# Patient Record
Sex: Male | Born: 1955
Health system: Southern US, Community
[De-identification: ages and names within clinical notes are randomized; demographics above are authoritative.]

---

## 2003-02-11 ENCOUNTER — Encounter: Admission: RE | Admit: 2003-02-11 | Discharge: 2003-05-12 | Payer: Self-pay | Admitting: *Deleted

## 2003-06-04 ENCOUNTER — Encounter: Admission: RE | Admit: 2003-06-04 | Discharge: 2003-09-02 | Payer: Self-pay | Admitting: *Deleted

## 2004-10-16 ENCOUNTER — Encounter: Admission: RE | Admit: 2004-10-16 | Discharge: 2004-10-16 | Payer: Self-pay | Admitting: General Surgery

## 2004-10-20 ENCOUNTER — Ambulatory Visit (HOSPITAL_BASED_OUTPATIENT_CLINIC_OR_DEPARTMENT_OTHER): Admission: RE | Admit: 2004-10-20 | Discharge: 2004-10-20 | Payer: Self-pay | Admitting: General Surgery

## 2004-10-20 ENCOUNTER — Ambulatory Visit (HOSPITAL_COMMUNITY): Admission: RE | Admit: 2004-10-20 | Discharge: 2004-10-20 | Payer: Self-pay | Admitting: General Surgery

## 2012-01-05 ENCOUNTER — Other Ambulatory Visit: Payer: Self-pay | Admitting: Endocrinology

## 2012-01-05 ENCOUNTER — Ambulatory Visit
Admission: RE | Admit: 2012-01-05 | Discharge: 2012-01-05 | Disposition: A | Discharge status: Home / Self Care | Payer: BC Managed Care – PPO | Source: Ambulatory Visit | Attending: Endocrinology | Admitting: Endocrinology

## 2012-01-05 DIAGNOSIS — E049 Nontoxic goiter, unspecified: Secondary | ICD-10-CM

## 2014-05-29 IMAGING — US US SOFT TISSUE HEAD/NECK
1 series · 14 of 25 positions shown · non-contrast
Comparison: None.

CLINICAL DATA: Thyromegaly

THYROID ULTRASOUND
TECHNIQUE: Ultrasound examination of the thyroid gland and adjacent
soft tissues was performed.

[Series 1: us soft tissue head/neck · 0.07mm/px · 14 of 30 slices shown]
[im 1/30]
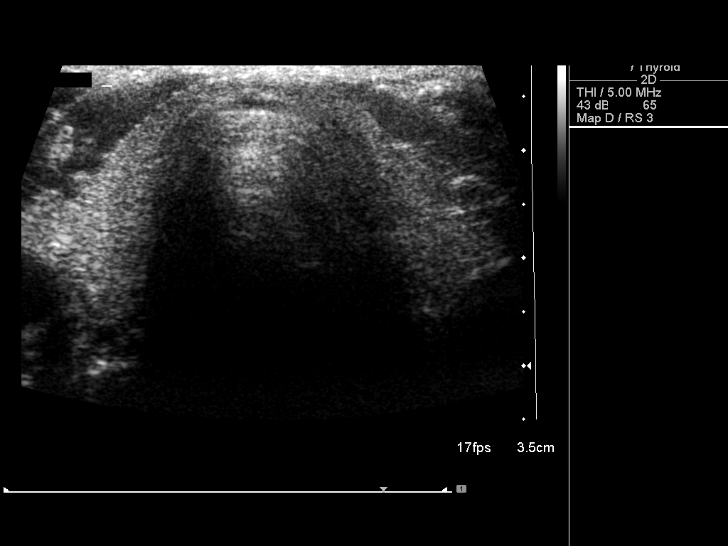
[im 3/30]
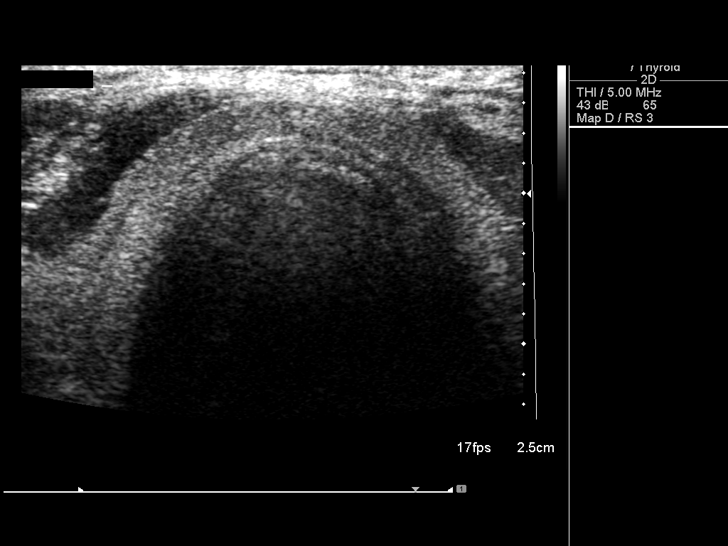
[im 5/30]
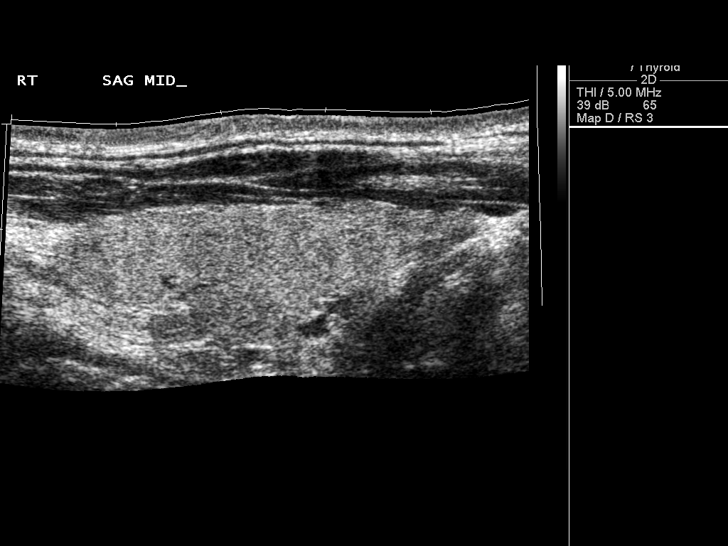
[im 8/30]
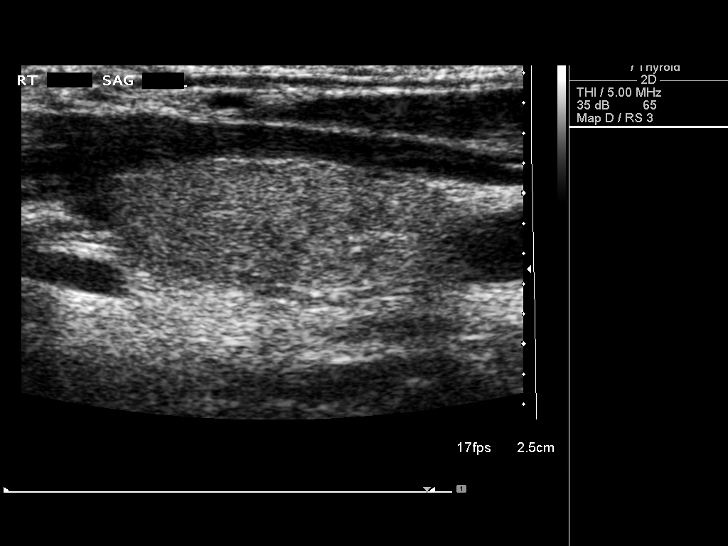
[im 10/30]
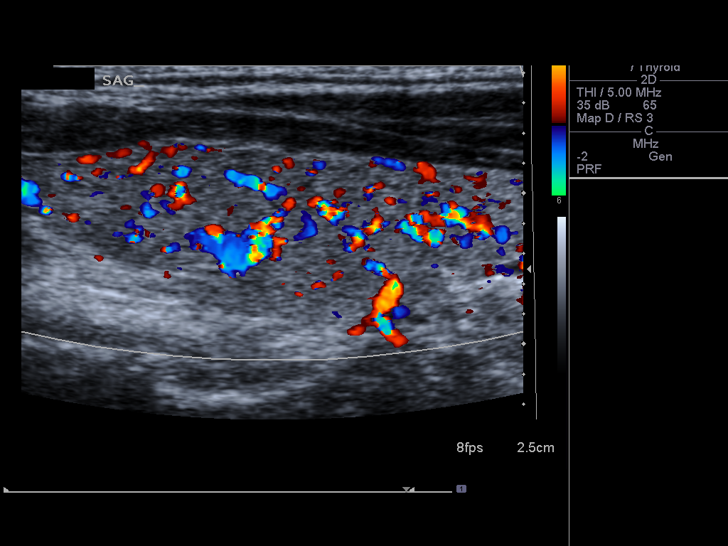
[im 11/30]
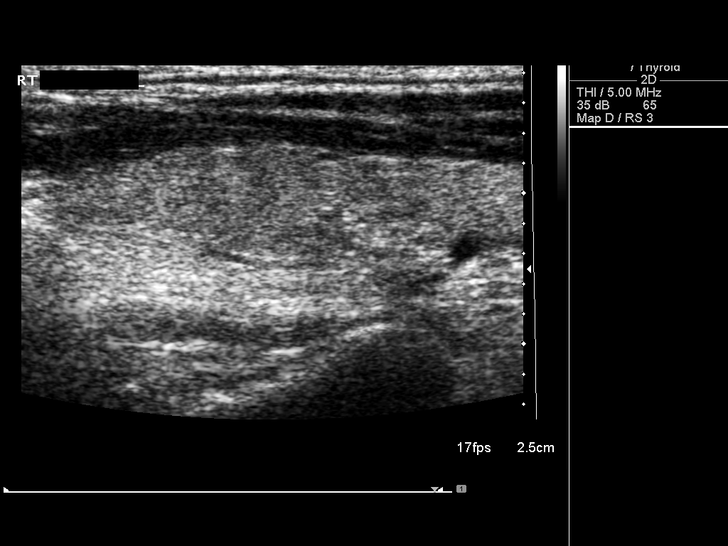
[im 14/30]
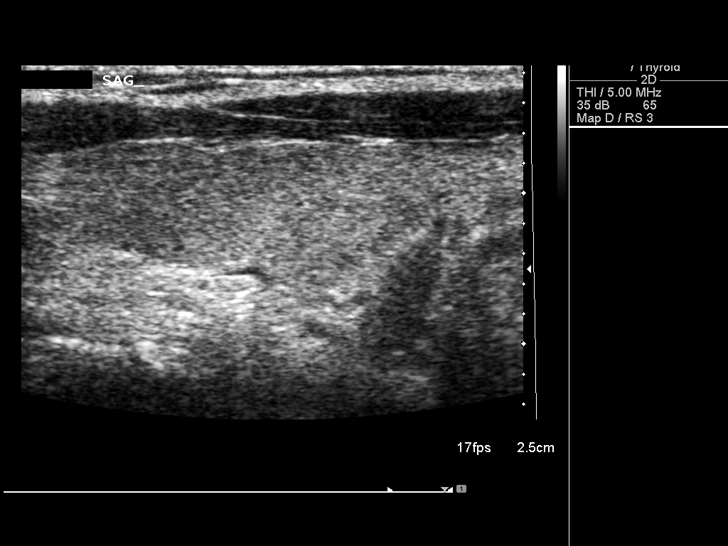
[im 16/30]
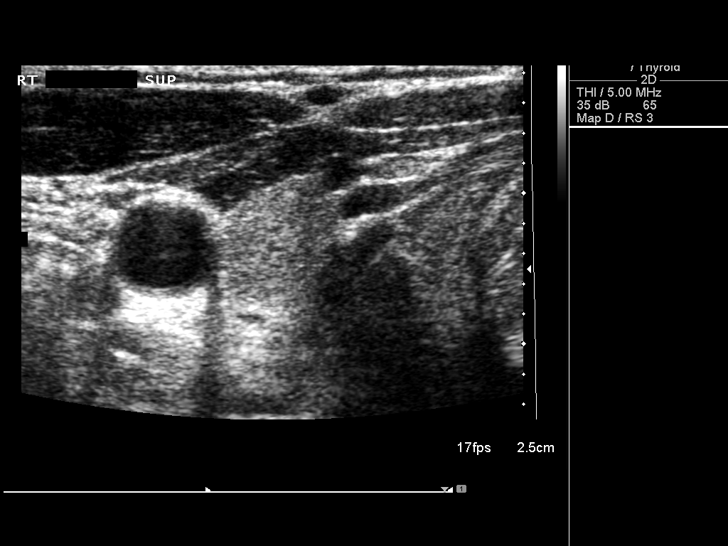
[im 19/30]
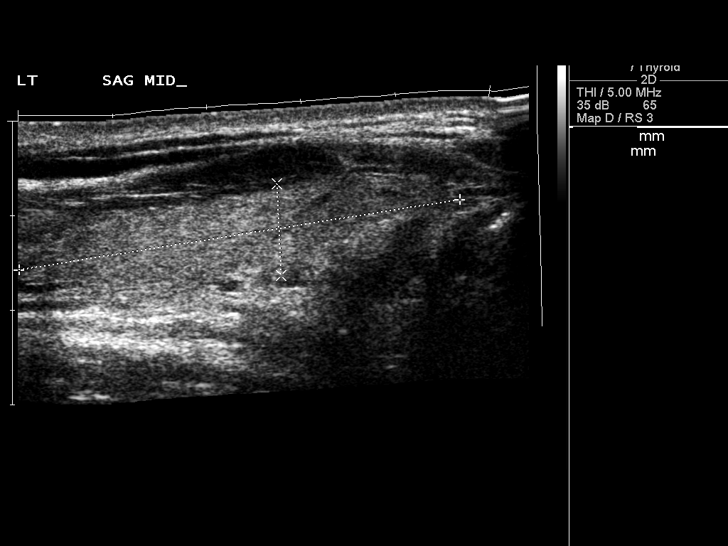
[im 20/30]
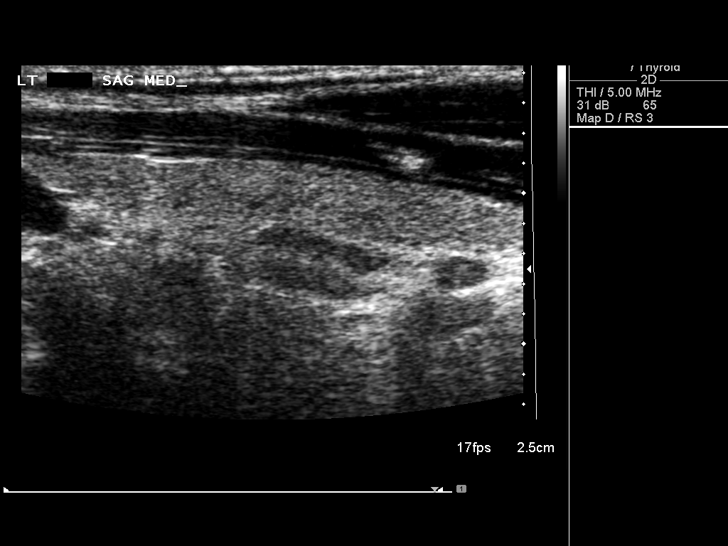
[im 22/30]
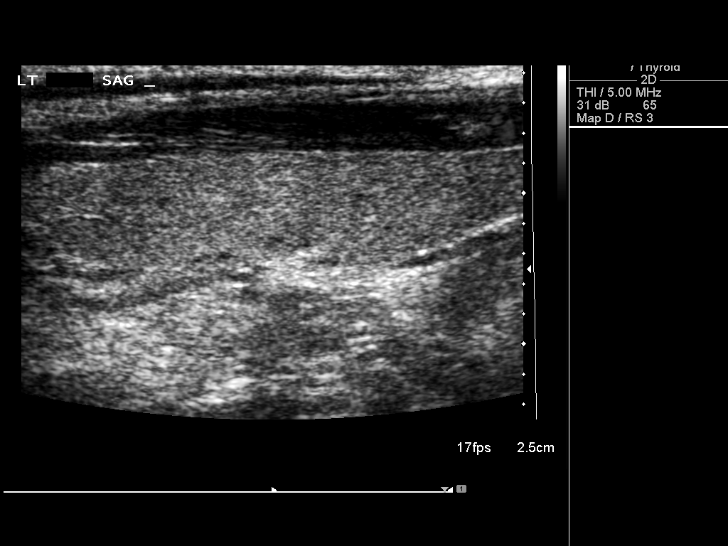
[im 25/30]
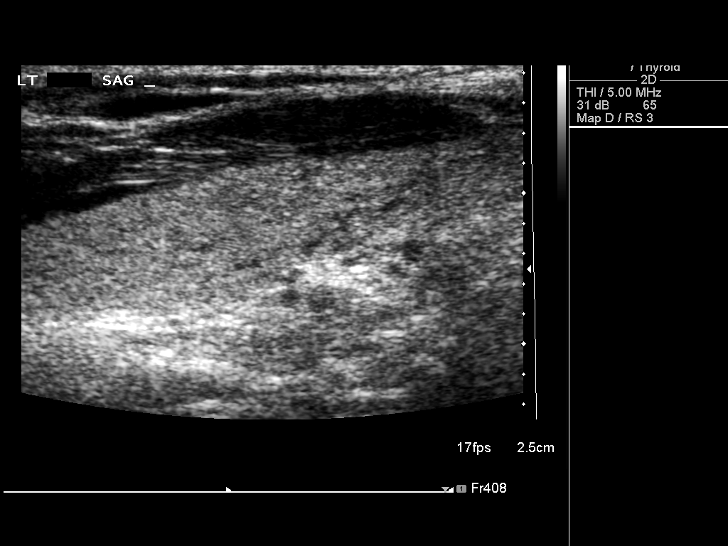
[im 27/30]
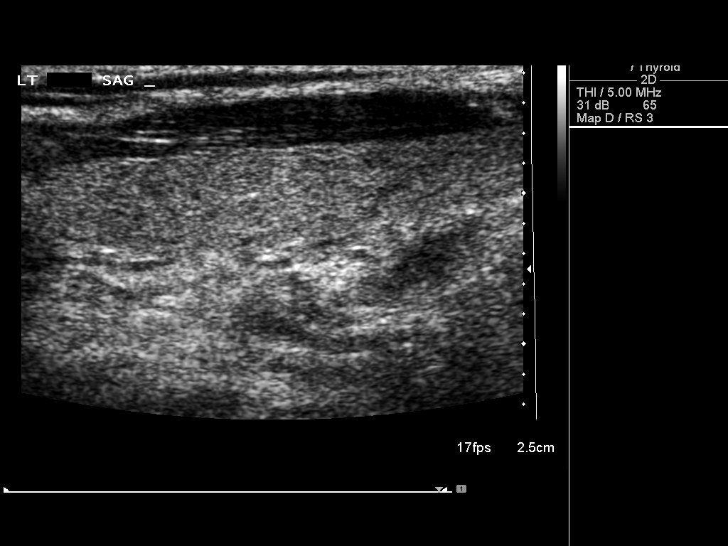
[im 30/30]
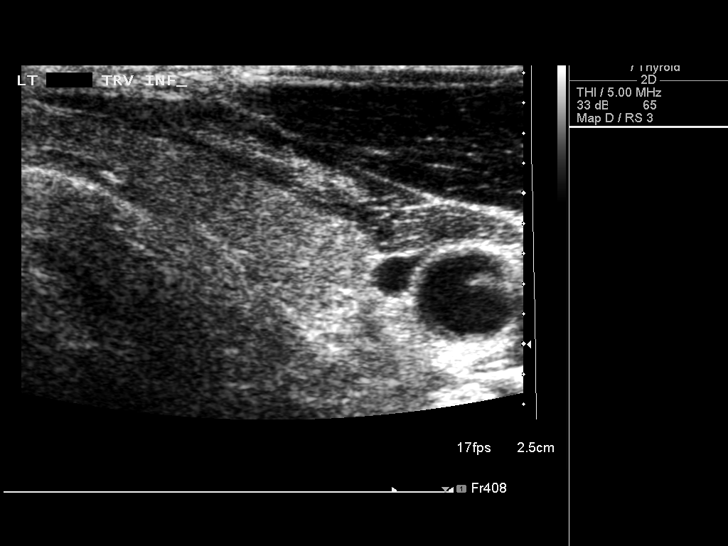

[14 of 25 positions shown; findings below may reference images not displayed]

FINDINGS: Right thyroid lobe:  4.2 x 1.4 x 1.7 cm.  Homogeneous.
Left thyroid lobe:  4.7 x 1.0 x 1.3 cm.  Homogeneous.
Isthmus:  3 mm in thickness.

Focal nodules:  None.

Lymphadenopathy:  None visualized.
IMPRESSION: Within normal limits.

## 2018-01-13 ENCOUNTER — Ambulatory Visit
Admission: RE | Admit: 2018-01-13 | Discharge: 2018-01-13 | Disposition: A | Discharge status: Home / Self Care | Payer: BLUE CROSS/BLUE SHIELD | Source: Ambulatory Visit | Attending: Family Medicine | Admitting: Family Medicine

## 2018-01-13 ENCOUNTER — Other Ambulatory Visit: Payer: Self-pay | Admitting: Family Medicine

## 2018-01-13 DIAGNOSIS — M79641 Pain in right hand: Secondary | ICD-10-CM

## 2018-03-02 ENCOUNTER — Encounter: Payer: Self-pay | Admitting: Pulmonary Disease

## 2018-03-29 ENCOUNTER — Encounter: Payer: Self-pay | Admitting: Pulmonary Disease

## 2018-03-29 ENCOUNTER — Ambulatory Visit (INDEPENDENT_AMBULATORY_CARE_PROVIDER_SITE_OTHER): Payer: BLUE CROSS/BLUE SHIELD | Admitting: Pulmonary Disease

## 2018-03-29 ENCOUNTER — Telehealth: Payer: Self-pay | Admitting: Pulmonary Disease

## 2018-03-29 VITALS — BP 152/94 | HR 85

## 2018-03-29 DIAGNOSIS — G4733 Obstructive sleep apnea (adult) (pediatric): Secondary | ICD-10-CM | POA: Diagnosis not present

## 2018-03-29 NOTE — Progress Notes (Signed)
Matthew Mosley    [id number]    [birth year]  Primary Care Physician:Reade, Molly Maduro, MD  Referring Physician: Elias Else, MD [phone] [address] [place], [place] [address]  Chief complaint:   Recent diagnosis of obstructive sleep apnea  HPI:  I am still study consistent with mild obstructive sleep apnea Significant daytime symptoms Spouse has noticed several times at night that he is not breathing and had to nudge him to breathe He goes to bed about 10:30 PM, falls asleep in about 10 minutes Wakes up about 6 AM No recent weight changes No dry mouth in the mornings No memory issues  He does have gasping respirations at night sometimes  No significant occupational predisposition  Pets:cat   No outpatient encounter medications on file as of 03/29/2018.   No facility-administered encounter medications on file as of 03/29/2018.     Allergies as of 03/29/2018  . (Not on File)    No past medical history on file.  No family history on file.  Social History   Socioeconomic History  . Marital status: Married    Spouse name: Not on file  . Number of children: Not on file  . Years of education: Not on file  . Highest education level: Not on file  Occupational History  . Not on file  Social Needs  . Financial resource strain: Not on file  . Food insecurity:    Worry: Not on file    Inability: Not on file  . Transportation needs:    Medical: Not on file    Non-medical: Not on file  Tobacco Use  . Smoking status: Never Smoker  . Smokeless tobacco: Never Used  Substance and Sexual Activity  . Alcohol use: Not on file  . Drug use: Not on file  . Sexual activity: Not on file  Lifestyle  . Physical activity:    Days per week: Not on file    Minutes per session: Not on file  . Stress: Not on file  Relationships  . Social connections:    Talks on phone: Not on file    Gets together: Not on file    Attends religious service: Not on file   Active member of club or organization: Not on file    Attends meetings of clubs or organizations: Not on file    Relationship status: Not on file  . Intimate partner violence:    Fear of current or ex partner: Not on file    Emotionally abused: Not on file    Physically abused: Not on file    Forced sexual activity: Not on file  Other Topics Concern  . Not on file  Social History Narrative  . Not on file    Review of Systems  Constitutional: Negative.  Negative for diaphoresis.  HENT: Negative.   Eyes: Negative.   Respiratory: Positive for apnea. Negative for cough and choking.   Cardiovascular: Negative.   Gastrointestinal: Negative.   Endocrine: Negative.   Genitourinary: Negative.   Allergic/Immunologic: Negative.   Neurological: Negative.   Psychiatric/Behavioral: Positive for sleep disturbance.  All other systems reviewed and are negative.   Vitals:   03/29/18 1515  BP: (!) 152/94  Pulse: 85  SpO2: 96%     Physical Exam  Constitutional: He is oriented to person, place, and time. He appears well-developed and well-nourished.  HENT:  Head: Normocephalic.  Mallampati 2  Eyes: Pupils are equal, round, and reactive to  light. Conjunctivae and EOM are normal. Right eye exhibits no discharge. Left eye exhibits no discharge.  Neck: Normal range of motion. Neck supple. No tracheal deviation present. No thyromegaly present.  Cardiovascular: Normal rate and regular rhythm.  No murmur heard. Pulmonary/Chest: Effort normal and breath sounds normal. No respiratory distress. He has no wheezes.  Abdominal: Soft. Bowel sounds are normal. He exhibits no distension.  Musculoskeletal: Normal range of motion. He exhibits no edema.  Neurological: He is alert and oriented to person, place, and time. He has normal reflexes.  Skin: Skin is warm and dry. No erythema.  Psychiatric: He has a normal mood and affect.   Data Reviewed: Sleep study did reveal mild obstructive sleep  apnea  Assessment:   Mild obstructive sleep apnea with significant daytime symptoms  Excessive daytime sleepiness   Plan/Recommendations:  We will start the patient on auto titrating CPAP therapy  Pressure settings of 5-15  We will see him back in about 2 months following initiation of treatment  Follow-up with compliance monitoring   Virl Diamond MD [facility] 03/29/2018, 3:20 PM  CC: Elias Else, MD

## 2018-03-29 NOTE — Telephone Encounter (Signed)
Home sleep study did reveal mild obstructive sleep apnea  We will place him on auto titrating CPAP pressure settings of 5-15  We will see him back in the office in about 6 to 8 weeks with compliance monitoring

## 2018-03-29 NOTE — Patient Instructions (Addendum)
History of obstructive sleep apnea diagnosed recently  We do not have the report currently available for you  Options of treatment as discussed  We will take a look at the study and give you call we will respect to CPAP treatment  I would like to see you back in the office 6 to 8 weeks after you start treatment to evaluate efficiency of the treatment

## 2018-03-30 NOTE — Telephone Encounter (Signed)
Spoke with patient, informed patient of results per MD Olalere. Voiced understanding. Order placed for cpap. Nothing further needed at this time. Appointment made for 05/16/2014 at 2:00pm for follow up.

## 2018-03-30 NOTE — Telephone Encounter (Signed)
Called and left a message on machine. Will call back.

## 2018-03-30 NOTE — Telephone Encounter (Signed)
Pt is calling back [phone]

## 2018-05-16 ENCOUNTER — Ambulatory Visit: Payer: BLUE CROSS/BLUE SHIELD | Admitting: Pulmonary Disease

## 2018-06-01 ENCOUNTER — Ambulatory Visit: Payer: BLUE CROSS/BLUE SHIELD | Admitting: Pulmonary Disease

## 2018-06-01 ENCOUNTER — Encounter: Payer: Self-pay | Admitting: Pulmonary Disease

## 2018-06-01 VITALS — BP 110/84 | HR 66 | Ht 68.0 in | Wt 181.0 lb

## 2018-06-01 DIAGNOSIS — G4733 Obstructive sleep apnea (adult) (pediatric): Secondary | ICD-10-CM | POA: Diagnosis not present

## 2018-06-01 DIAGNOSIS — Z9989 Dependence on other enabling machines and devices: Secondary | ICD-10-CM | POA: Diagnosis not present

## 2018-06-01 NOTE — Progress Notes (Signed)
Matthew Mosley    [id number]    [birth year]  Primary Care Physician:Reade, Molly Maduroobert, MD  Referring Physician: Elias Elseeade, Robert, MD [phone] [address] [place], [place] [address]  Chief complaint:   Recent diagnosis of obstructive sleep apnea Tolerating CPAP well  HPI:  Denies significant daytime symptoms are present Tolerating CPAP well, had to change masks initially Is gotten used to using it on a regular basis Wakes up feeling like is not a good nights rest  He goes to bed about 10:30 PM, falls asleep in about 10 minutes Wakes up about 6 AM No recent weight changes No dry mouth in the mornings No memory issues  No significant occupational predisposition  Pets:cat   Outpatient Encounter Medications as of 06/01/2018  Medication Sig  . ALPRAZolam (XANAX) 0.5 MG tablet   . citalopram (CELEXA) 20 MG tablet   . lovastatin (MEVACOR) 20 MG tablet   . NOVOLOG 100 UNIT/ML injection    No facility-administered encounter medications on file as of 06/01/2018.     Allergies as of 06/01/2018  . (Not on File)    No past medical history on file.  No family history on file.  Social History   Socioeconomic History  . Marital status: Married    Spouse name: Not on file  . Number of children: Not on file  . Years of education: Not on file  . Highest education level: Not on file  Occupational History  . Not on file  Social Needs  . Financial resource strain: Not on file  . Food insecurity:    Worry: Not on file    Inability: Not on file  . Transportation needs:    Medical: Not on file    Non-medical: Not on file  Tobacco Use  . Smoking status: Never Smoker  . Smokeless tobacco: Never Used  Substance and Sexual Activity  . Alcohol use: Not on file  . Drug use: Not on file  . Sexual activity: Not on file  Lifestyle  . Physical activity:    Days per week: Not on file    Minutes per session: Not on file  . Stress: Not on file  Relationships  .  Social connections:    Talks on phone: Not on file    Gets together: Not on file    Attends religious service: Not on file    Active member of club or organization: Not on file    Attends meetings of clubs or organizations: Not on file    Relationship status: Not on file  . Intimate partner violence:    Fear of current or ex partner: Not on file    Emotionally abused: Not on file    Physically abused: Not on file    Forced sexual activity: Not on file  Other Topics Concern  . Not on file  Social History Narrative  . Not on file    Review of Systems  Constitutional: Negative.  Negative for diaphoresis.  HENT: Negative.   Eyes: Negative.   Respiratory: Positive for apnea. Negative for cough and choking.   Psychiatric/Behavioral: Positive for sleep disturbance.  All other systems reviewed and are negative.   Vitals:   06/01/18 1415  BP: 110/84  Pulse: 66  SpO2: 98%     Physical Exam  Constitutional: He appears well-developed and well-nourished.  HENT:  Head: Normocephalic and atraumatic.  Mallampati 2  Eyes: Pupils are equal, round, and reactive to light.  Conjunctivae and EOM are normal. Right eye exhibits no discharge. Left eye exhibits no discharge.  Neck: Normal range of motion. Neck supple. No tracheal deviation present. No thyromegaly present.  Cardiovascular: Normal rate and regular rhythm.  No murmur heard. Pulmonary/Chest: Effort normal and breath sounds normal. No respiratory distress. He has no wheezes.  Abdominal: Soft. Bowel sounds are normal. He exhibits no distension. There is no tenderness.   Data Reviewed: Sleep study did reveal mild obstructive sleep apnea  Download from his machine does reveal 100% compliance with a residual AHI of 3.6, no significant leaks  Assessment:   Mild obstructive sleep apnea currently on auto titrating CPAP Excessive daytime sleepiness appears to be better Tolerating treatment well  Plan/Recommendations:  Continue  auto titrating CPAP set at 5-15  I will see him back in the office in about 6 months  Encouraged to call if any significant concerns   Virl Diamond MD [facility] 06/01/2018, 2:21 PM  CC: Elias Else, MD

## 2018-06-01 NOTE — Patient Instructions (Signed)
Obstructive sleep apnea Adequately treated with auto titrating CPAP  We are not able to get a download at present but CPAP appears to be working well  I will see her back in the office in about 6 months  Call with any significant concerns or questions

## 2019-10-27 ENCOUNTER — Ambulatory Visit: Payer: Self-pay | Attending: Internal Medicine

## 2019-10-27 DIAGNOSIS — Z23 Encounter for immunization: Secondary | ICD-10-CM

## 2019-10-27 NOTE — Progress Notes (Signed)
   Covid-19 Vaccination Clinic  Name:  OWYN RAULSTON    MRN: [id number] DOB: [birth year]  10/27/2019  Mr. Barth was observed post Covid-19 immunization for 15 minutes without incident. He was provided with Vaccine Information Sheet and instruction to access the V-Safe system.   Mr. Barnier was instructed to call 911 with any severe reactions post vaccine: Marland Kitchen Difficulty breathing  . Swelling of face and throat  . A fast heartbeat  . A bad rash all over body  . Dizziness and weakness   Immunizations Administered    Name Date Dose VIS Date Route   Pfizer COVID-19 Vaccine 10/27/2019 10:38 AM 0.3 mL 06/29/2019 Intramuscular   Manufacturer: [employer], [employer]   Lot: [id number]   NDC: [address]-7

## 2019-11-20 ENCOUNTER — Ambulatory Visit: Payer: Self-pay | Attending: Internal Medicine

## 2019-11-20 DIAGNOSIS — Z23 Encounter for immunization: Secondary | ICD-10-CM

## 2019-11-20 NOTE — Progress Notes (Signed)
   Covid-19 Vaccination Clinic  Name:  Matthew Mosley    MRN: [id number] DOB: [birth year]  11/20/2019  Mr. Matthew Mosley was observed post Covid-19 immunization for 15 minutes without incident. He was provided with Vaccine Information Sheet and instruction to access the V-Safe system.   Mr. Matthew Mosley was instructed to call 911 with any severe reactions post vaccine: Marland Kitchen Difficulty breathing  . Swelling of face and throat  . A fast heartbeat  . A bad rash all over body  . Dizziness and weakness   Immunizations Administered    Name Date Dose VIS Date Route   Pfizer COVID-19 Vaccine 11/20/2019 11:54 AM 0.3 mL 09/12/2018 Intramuscular   Manufacturer: [employer], [employer]   Lot: [id number]   NDC: [address]-7

## 2020-02-06 ENCOUNTER — Encounter: Payer: Self-pay | Admitting: Adult Health

## 2020-02-06 ENCOUNTER — Other Ambulatory Visit: Payer: Self-pay

## 2020-02-06 ENCOUNTER — Ambulatory Visit (INDEPENDENT_AMBULATORY_CARE_PROVIDER_SITE_OTHER): Payer: 59 | Admitting: Adult Health

## 2020-02-06 DIAGNOSIS — G4733 Obstructive sleep apnea (adult) (pediatric): Secondary | ICD-10-CM | POA: Diagnosis not present

## 2020-02-06 NOTE — Progress Notes (Signed)
@Patient  ID: , male    DOB: March 12, 1956, 64 y.o.   MRN: [id number]  Chief Complaint  Patient presents with  . Follow-up    OSA    Referring provider: [id number], MD  HPI: 64 year old male followed for obstructive sleep apnea on nocturnal CPAP Medical history is significant for T1 DM   TEST/EVENTS :  September 2019 home sleep study showed mild obstructive sleep apnea  02/06/2020 Follow up : OSA  Patient returns for a follow-up visit for sleep apnea.  Patient was last seen September 2019.  Patient says he remains on CPAP at bedtime.  Says he tries to wear it every night for about 6 to 7 hours.  Does not have daytime sleepiness.  Says he would like to switch from nasal pillows to a full mask.  Feels that the pressure is not strong enough at times.  CPAP download shows excellent compliance with 100% usage.  Daily average usage at 6.5 hours.  Patient is on auto CPAP 5 to 15 cm H2O.  AHI is 14.6.  Positive leaks.October 2019 he is very active, [employer]. Constantly working on projects.   Not on File  Immunization History  Administered Date(s) Administered  . Influenza Whole 05/01/2018  . PFIZER SARS-COV-2 Vaccination 10/27/2019, 11/20/2019  . Pneumococcal Polysaccharide-23 05/01/2018    History reviewed. No pertinent past medical history.  Tobacco History: Social History   Tobacco Use  Smoking Status Never Smoker  Smokeless Tobacco Never Used   Counseling given: Not Answered   Outpatient Medications Prior to Visit  Medication Sig Dispense Refill  . acetaminophen (TYLENOL) 325 MG tablet Take 650 mg by mouth every 6 (six) hours as needed.    . ALPRAZolam (XANAX) 0.5 MG tablet   0  . aspirin 81 MG chewable tablet Chew by mouth daily.    05/03/2018 buPROPion (WELLBUTRIN XL) 150 MG 24 hr tablet Take 150 mg by mouth daily.    . cholecalciferol (VITAMIN D3) 25 MCG (1000 UNIT) tablet Take 2,000 Units by mouth daily.    . citalopram (CELEXA) 20 MG tablet   2  . famotidine  (PEPCID) 40 MG tablet Take 40 mg by mouth at bedtime.    . lovastatin (MEVACOR) 20 MG tablet   3  . Multiple Vitamin (MULTIVITAMIN WITH MINERALS) TABS tablet Take 1 tablet by mouth daily.    Marland Kitchen NOVOLOG 100 UNIT/ML injection   9   No facility-administered medications prior to visit.     Review of Systems:   Constitutional:   No  weight loss, night sweats,  Fevers, chills, fatigue, or  lassitude.  HEENT:   No headaches,  Difficulty swallowing,  Tooth/dental problems, or  Sore throat,                No sneezing, itching, ear ache, nasal congestion, post nasal drip,   CV:  No chest pain,  Orthopnea, PND, swelling in lower extremities, anasarca, dizziness, palpitations, syncope.   GI  No heartburn, indigestion, abdominal pain, nausea, vomiting, diarrhea, change in bowel habits, loss of appetite, bloody stools.   Resp: No shortness of breath with exertion or at rest.  No excess mucus, no productive cough,  No non-productive cough,  No coughing up of blood.  No change in color of mucus.  No wheezing.  No chest wall deformity  Skin: no rash or lesions.  GU: no dysuria, change in color of urine, no urgency or frequency.  No flank pain, no hematuria   MS:  No joint pain or swelling.  No decreased range of motion.  No back pain.    Physical Exam  BP 128/68 (BP Location: Left Arm, Cuff Size: Normal)   Pulse 75   Temp 98.5 F (36.9 C) (Oral)   Ht 5\' 7"  (1.702 m)   Wt 182 lb 9.6 oz (82.8 kg)   SpO2 97%   BMI 28.60 kg/m   GEN: A/Ox3; pleasant , NAD, well nourished    HEENT:  [place]/AT,   , NOSE-clear, THROAT-clear, no lesions, no postnasal drip or exudate noted. Class 2 MP airway   NECK:  Supple w/ fair ROM; no JVD; normal carotid impulses w/o bruits; no thyromegaly or nodules palpated; no lymphadenopathy.    RESP  Clear  P & A; w/o, wheezes/ rales/ or rhonchi. no accessory muscle use, no dullness to percussion  CARD:  RRR, no m/r/g, no peripheral edema, pulses intact, no cyanosis or  clubbing.  GI:   Soft & nt; nml bowel sounds; no organomegaly or masses detected.   Musco: Warm bil, no deformities or joint swelling noted.   Neuro: alert, no focal deficits noted.    Skin: Warm, no lesions or rashes    Lab Results:  CBC No results found for: WBC, RBC, HGB, HCT, PLT, MCV, MCH, MCHC, RDW, LYMPHSABS, MONOABS, EOSABS, BASOSABS  BMET No results found for: NA, K, CL, CO2, GLUCOSE, BUN, CREATININE, CALCIUM, GFRNONAA, GFRAA  BNP No results found for: BNP  ProBNP No results found for: PROBNP  Imaging: No results found.    No flowsheet data found.  No results found for: NITRICOXIDE      Assessment & Plan:   OSA (obstructive sleep apnea) Excellent compliance, need to adjust pressure and change mask.   Plan  Patient Instructions  Change CPAP pressure from 10 to 20 cm H2O. CPAP download in 4 weeks. Work on healthy weight loss Do not drive if sleepy Mask fitting at DME company Follow-up in 6 months with Dr. or Jase Himmelberger NP and As needed           Melida Quitter, NP 02/06/2020

## 2020-02-06 NOTE — Assessment & Plan Note (Signed)
Excellent compliance, need to adjust pressure and change mask.   Plan  Patient Instructions  Change CPAP pressure from 10 to 20 cm H2O. CPAP download in 4 weeks. Work on healthy weight loss Do not drive if sleepy Mask fitting at DME company Follow-up in 6 months with Dr. Melida Quitter or Jatniel Verastegui NP and As needed

## 2020-02-06 NOTE — Patient Instructions (Signed)
Change CPAP pressure from 10 to 20 cm H2O. CPAP download in 4 weeks. Work on healthy weight loss Do not drive if sleepy Mask fitting at DME company Follow-up in 6 months with Dr. Melida Quitter or Nitya Cauthon NP and As needed

## 2020-02-06 NOTE — Addendum Note (Signed)
Addended by: Dorisann Frames R on: 02/06/2020 10:06 AM   Modules accepted: Orders

## 2020-03-13 ENCOUNTER — Telehealth: Payer: Self-pay | Admitting: Adult Health

## 2020-03-13 DIAGNOSIS — G4733 Obstructive sleep apnea (adult) (pediatric): Secondary | ICD-10-CM

## 2020-03-13 NOTE — Telephone Encounter (Signed)
Called and spoke to pt. Pt states his face mask has been bothering him and causing skin breakdown on the bridge of his nose. Pt states he has been seen by his PCP and was given abx for the sore. Abx started last night, pt unsure of the medication. Pt states he recently finished abx due to a sore on his ear. Pt has type 1 DM. Pt currently has a newer resmed CPAP machine and is wearing a full face mask. Pt was switched to the full face mask after having leaking from former mask; covered mouth and with nasal pillows. Pt states it would get pulled off during the night. Pt questioning recs as he cannot wear his full face mask at this time while his sore heals. Pt requesting recs today so he does not miss another night wearing his CPAP.   Dr. Wynona Neat, please advise if pt can use a sample mask within the office. Thanks.

## 2020-03-13 NOTE — Telephone Encounter (Signed)
We can help him with any mask that is available that he may be able to try  Unfortunately, if his face breaks down significantly he will not be able to tolerate anything over it and that will need to be treated effectively before he is able to go back to using CPAP regularly.  Contact DME company to see whether they have moleskin-this may help decrease the irritation and pressure  Unfortunately, the reality is that if he has significant pain and discomfort from a skin breakdown, he will not be able to tolerate CPAP.

## 2020-03-13 NOTE — Telephone Encounter (Signed)
Spoke with the pt and notified of recs per Dr Wynona Neat  He verbalized understanding  I am sending order to [employer] to ask about the moleskin mask  He will call if not improving

## 2020-04-11 ENCOUNTER — Ambulatory Visit (INDEPENDENT_AMBULATORY_CARE_PROVIDER_SITE_OTHER): Payer: 59 | Admitting: Adult Health

## 2020-04-11 ENCOUNTER — Encounter: Payer: Self-pay | Admitting: Adult Health

## 2020-04-11 ENCOUNTER — Other Ambulatory Visit: Payer: Self-pay

## 2020-04-11 VITALS — BP 144/80 | HR 83 | Temp 97.4°F | Ht 67.0 in | Wt 180.2 lb

## 2020-04-11 DIAGNOSIS — Z23 Encounter for immunization: Secondary | ICD-10-CM

## 2020-04-11 DIAGNOSIS — G4733 Obstructive sleep apnea (adult) (pediatric): Secondary | ICD-10-CM | POA: Diagnosis not present

## 2020-04-11 NOTE — Assessment & Plan Note (Signed)
Sleep apnea issues with full facemask with pressure ulcer/irritation from mask.  Will change to a DreamWear fullface which does not cover the top of the nose. Patient Instructions  May try the dream wear full face mask.  Continue on CPAP At bedtime   Work on healthy weight loss Do not drive if sleepy CPAP download in 6 weeks  Flu shot today .  Follow-up in 6 months with Dr. Melida Quitter or [place] NP and As needed

## 2020-04-11 NOTE — Patient Instructions (Addendum)
May try the dream wear full face mask.  Continue on CPAP At bedtime   Work on healthy weight loss Do not drive if sleepy CPAP download in 6 weeks  Flu shot today .  Follow-up in 6 months with Dr. Melida Quitter or Shloima Clinch NP and As needed

## 2020-04-11 NOTE — Addendum Note (Signed)
Addended byKatherine Mantle on: 04/11/2020 03:42 PM   Modules accepted: Orders

## 2020-04-11 NOTE — Progress Notes (Signed)
@Patient  ID: , male    DOB: 08-10-1955, 64 y.o.   MRN: [id number]  Chief Complaint  Patient presents with  . Follow-up    OSA     Referring provider: [id number], MD  HPI: 64 year old male followed for obstructive sleep apnea on nocturnal CPAP Medical history significant for type 1 diabetes  TEST/EVENTS :  September 2019 home sleep study showed mild obstructive sleep apnea  04/11/2020 Follow up : OSA  Patient returns for a 23-month follow-up.  Patient has underlying mild obstructive sleep apnea is on nocturnal CPAP.  Last visit wanted to change mask from nasal pillows to a full facemask.  Unfortunately patient had significant difficulties and caused a pressure sore on the top of his nose.  She has been unable to wear his CPAP for the last month.  Patient wants to restart CPAP, says the few days that he was able to wear the CPAP it really worked with a new pressure setting.  And he felt much more rested while wearing it.  We looked at different mask options he would like to try the DreamWear full facemask.   No Known Allergies  Immunization History  Administered Date(s) Administered  . Influenza Whole 05/01/2018  . PFIZER SARS-COV-2 Vaccination 10/27/2019, 11/20/2019  . Pneumococcal Polysaccharide-23 05/01/2018    History reviewed. No pertinent past medical history.  Tobacco History: Social History   Tobacco Use  Smoking Status Never Smoker  Smokeless Tobacco Never Used   Counseling given: Not Answered   Outpatient Medications Prior to Visit  Medication Sig Dispense Refill  . ALPRAZolam (XANAX) 0.5 MG tablet   0  . aspirin 81 MG chewable tablet Chew by mouth daily.    05/03/2018 buPROPion (WELLBUTRIN XL) 150 MG 24 hr tablet Take 150 mg by mouth daily.    . cholecalciferol (VITAMIN D3) 25 MCG (1000 UNIT) tablet Take 2,000 Units by mouth daily.    . citalopram (CELEXA) 20 MG tablet   2  . famotidine (PEPCID) 40 MG tablet Take 40 mg by mouth at bedtime.    .  lovastatin (MEVACOR) 20 MG tablet   3  . Multiple Vitamin (MULTIVITAMIN WITH MINERALS) TABS tablet Take 1 tablet by mouth daily.    . naproxen sodium (ALEVE) 220 MG tablet Take 220 mg by mouth daily as needed.    Marland Kitchen NOVOLOG 100 UNIT/ML injection   9  . acetaminophen (TYLENOL) 325 MG tablet Take 650 mg by mouth every 6 (six) hours as needed.     No facility-administered medications prior to visit.     Review of Systems:   Constitutional:   No  weight loss, night sweats,  Fevers, chills, fatigue, or  lassitude.  HEENT:   No headaches,  Difficulty swallowing,  Tooth/dental problems, or  Sore throat,                No sneezing, itching, ear ache, nasal congestion, post nasal drip,   CV:  No chest pain,  Orthopnea, PND, swelling in lower extremities, anasarca, dizziness, palpitations, syncope.   GI  No heartburn, indigestion, abdominal pain, nausea, vomiting, diarrhea, change in bowel habits, loss of appetite, bloody stools.   Resp: No shortness of breath with exertion or at rest.  No excess mucus, no productive cough,  No non-productive cough,  No coughing up of blood.  No change in color of mucus.  No wheezing.  No chest wall deformity  Skin: no rash or lesions.  GU: no dysuria, change in  color of urine, no urgency or frequency.  No flank pain, no hematuria   MS:  No joint pain or swelling.  No decreased range of motion.  No back pain.    Physical Exam  BP (!) 144/80 (BP Location: Left Arm, Patient Position: Sitting, Cuff Size: Normal)   Pulse 83   Temp (!) 97.4 F (36.3 C) (Temporal)   Ht 5\' 7"  (1.702 m)   Wt 180 lb 3.2 oz (81.7 kg)   SpO2 96%   BMI 28.22 kg/m   GEN: A/Ox3; pleasant , NAD, well nourished    HEENT:  [place]/AT,    NOSE-clear, THROAT-clear, no lesions, no postnasal drip or exudate noted.   NECK:  Supple w/ fair ROM; no JVD; normal carotid impulses w/o bruits; no thyromegaly or nodules palpated; no lymphadenopathy.    RESP  Clear  P & A; w/o, wheezes/ rales/ or  rhonchi. no accessory muscle use, no dullness to percussion  CARD:  RRR, no m/r/g, no peripheral edema, pulses intact, no cyanosis or clubbing.  GI:   Soft & nt; nml bowel sounds; no organomegaly or masses detected.   Musco: Warm bil, no deformities or joint swelling noted.   Neuro: alert, no focal deficits noted.    Skin: Warm, slight redness along the bridge of the nose.  No open ulcers noted    Lab Results:  CBC No results found for: WBC, RBC, HGB, HCT, PLT, MCV, MCH, MCHC, RDW, LYMPHSABS, MONOABS, EOSABS, BASOSABS  BMET No results found for: NA, K, CL, CO2, GLUCOSE, BUN, CREATININE, CALCIUM, GFRNONAA, GFRAA  BNP No results found for: BNP  ProBNP No results found for: PROBNP  Imaging: No results found.    No flowsheet data found.  No results found for: NITRICOXIDE      Assessment & Plan:   OSA (obstructive sleep apnea) Sleep apnea issues with full facemask with pressure ulcer/irritation from mask.  Will change to a DreamWear fullface which does not cover the top of the nose. Patient Instructions  May try the dream wear full face mask.  Continue on CPAP At bedtime   Work on healthy weight loss Do not drive if sleepy CPAP download in 6 weeks  Flu shot today .  Follow-up in 6 months with Dr. or Freja Faro NP and As needed           Melida Quitter, NP 04/11/2020

## 2020-06-03 ENCOUNTER — Telehealth: Payer: Self-pay | Admitting: Adult Health

## 2020-06-03 DIAGNOSIS — G4733 Obstructive sleep apnea (adult) (pediatric): Secondary | ICD-10-CM

## 2020-06-03 NOTE — Telephone Encounter (Signed)
Spoke to patient, who is calling for update on cpap mask.  Order was placed to [facility] on 04/11/2020. Patient has not been contacted by [employer].   I have spoken to [facility] with [employer].  Dahlia Client stated that [employer] did not receive order. Order has been replaced. Patient is aware and voiced his understanding.  Nothing further is needed.

## 2020-06-06 IMAGING — CR DG HAND COMPLETE 3+V*R*
3 series · 3 of 3 positions shown · non-contrast
Comparison: None.

CLINICAL DATA: Right hand pain after injury yesterday.

EXAM:
RIGHT HAND - COMPLETE 3+ VIEW

[x hand pa right]
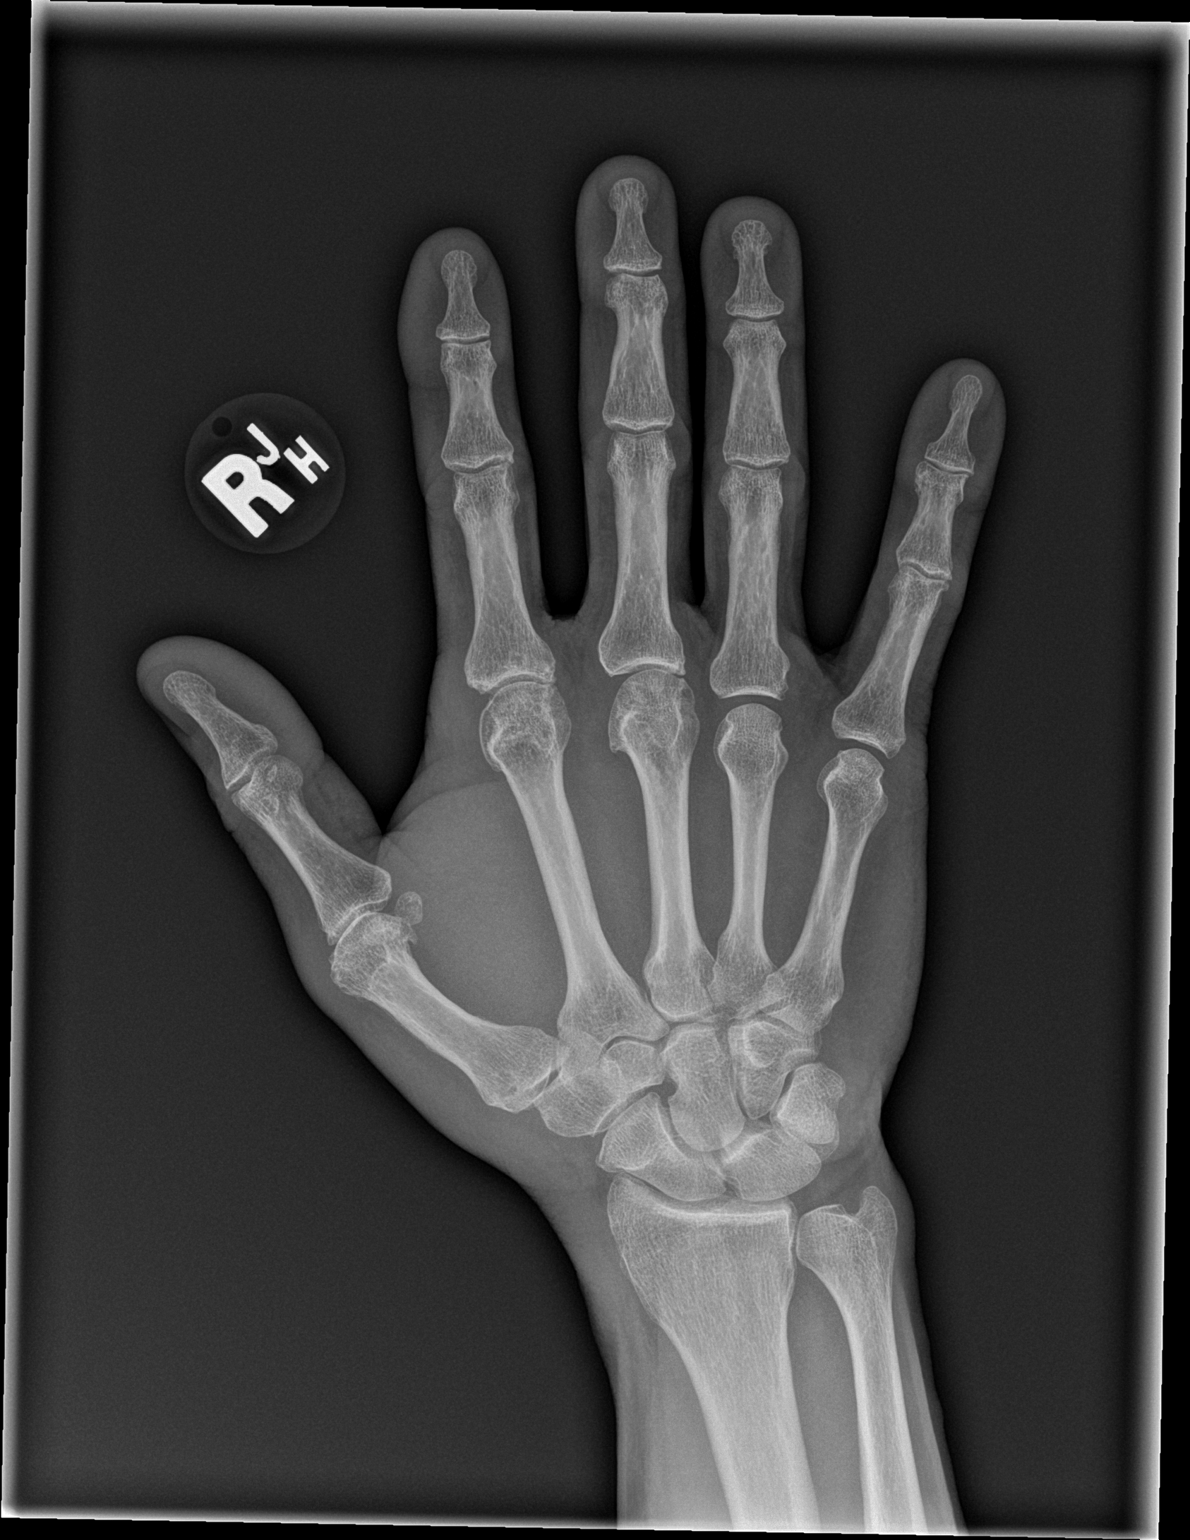

[x hand obl right]
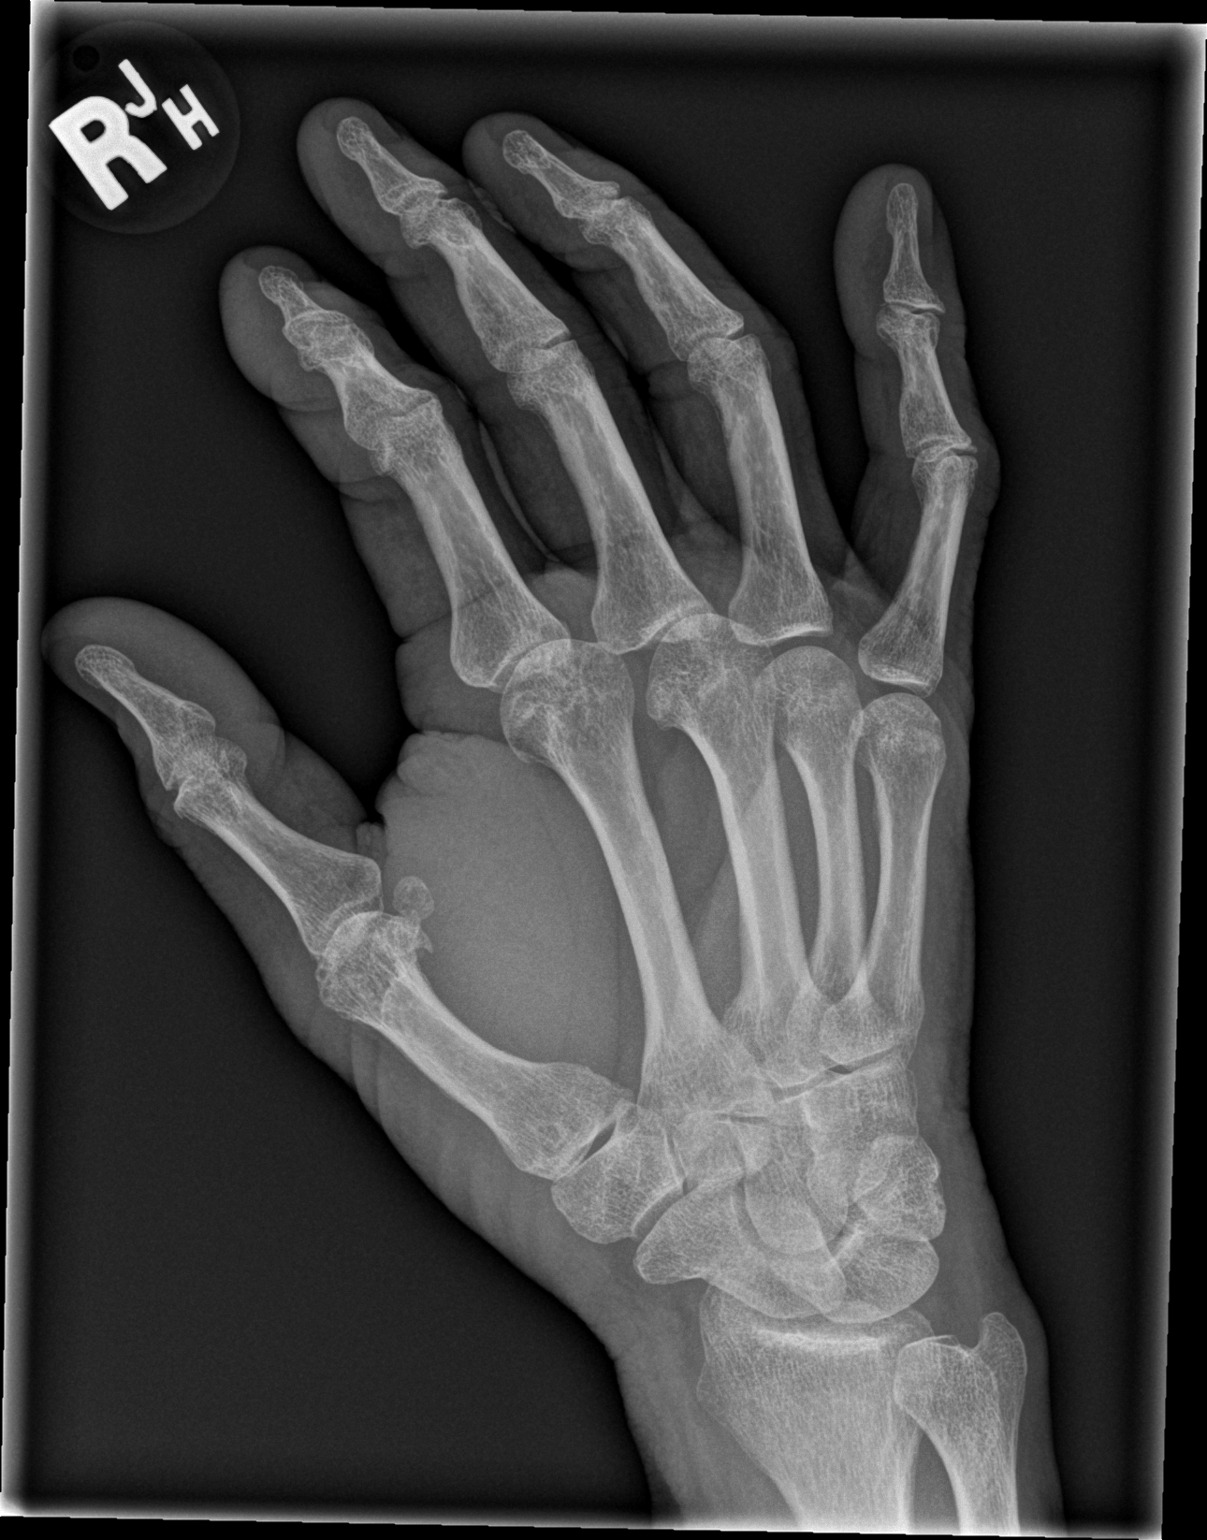

[x hand lat right]
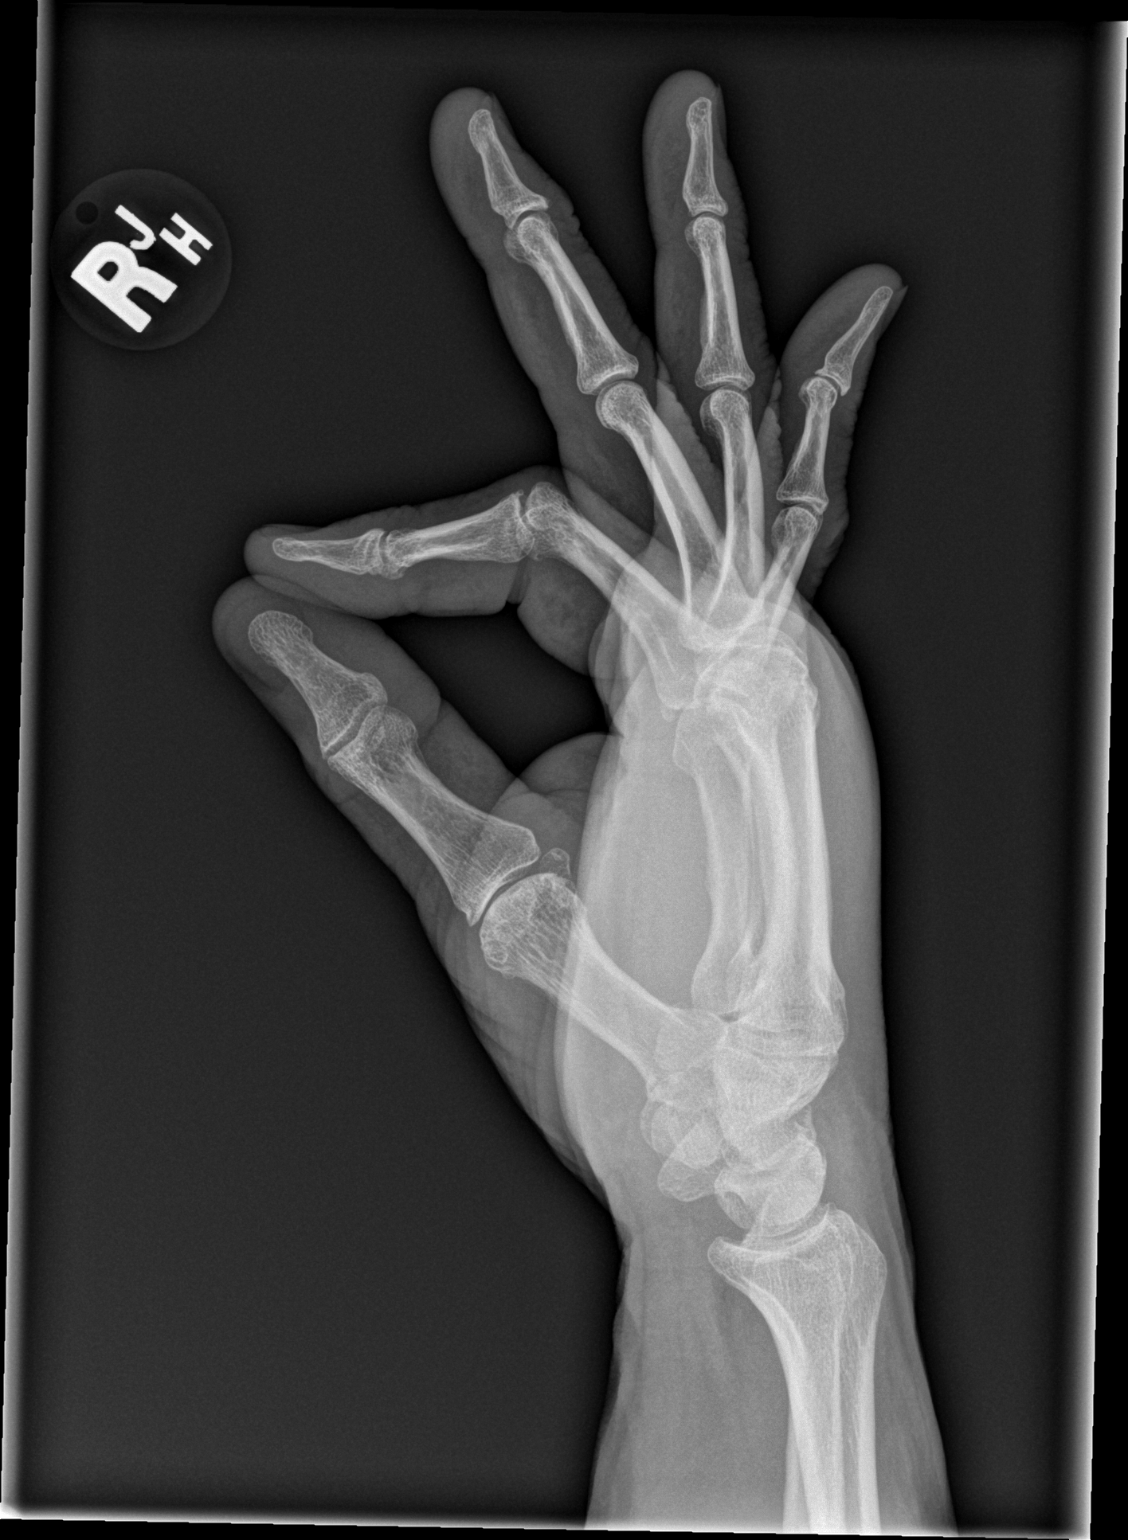

[3 of 3 positions shown; findings below may reference images not displayed]

FINDINGS: No acute fracture or dislocation. Mild osteoarthritis of the first
through third MCP joints and first CMC joint. Bone mineralization is
normal. Soft tissues are unremarkable.
IMPRESSION: 1.  No acute osseous abnormality.

## 2020-08-14 ENCOUNTER — Other Ambulatory Visit: Payer: Self-pay

## 2020-08-14 ENCOUNTER — Encounter: Payer: Self-pay | Admitting: Adult Health

## 2020-08-14 ENCOUNTER — Ambulatory Visit: Payer: 59 | Admitting: Adult Health

## 2020-08-14 VITALS — BP 130/90 | HR 79 | Temp 97.5°F | Ht 66.0 in | Wt 184.2 lb

## 2020-08-14 DIAGNOSIS — G4733 Obstructive sleep apnea (adult) (pediatric): Secondary | ICD-10-CM

## 2020-08-14 NOTE — Patient Instructions (Signed)
May try the dream wear full face mask.  Restart CPAP At bedtime   Work on healthy weight loss Do not drive if sleepy CPAP download in 6 weeks  Follow-up in 6 months with Dr. Melida Quitter or Haylyn Halberg NP and As needed

## 2020-08-14 NOTE — Progress Notes (Signed)
@Patient  ID: , male    DOB: 1956/02/23, 65 y.o.   MRN: [id number]  Chief Complaint  Patient presents with  . Follow-up    Referring provider: [id number], MD  HPI: 65 year old male followed for obstructive sleep apnea on nocturnal CPAP Medical history significant for type 1 diabetes  TEST/EVENTS :  September 2019 home sleep study showed mild obstructive sleep apnea  08/14/2020 Follow up : OSA  Patient presents for a follow-up visit.  Patient has underlying mild obstructive sleep apnea.  Patient has been having difficulty wearing his CPAP due to mask issues .  He was ordered a DreamWear full facemask.  Patient says he never received this.  We discussed importance of CPAP compliance.  We contacted his homecare company with orders for a DreamWear full facemask.  Patient has agreed to restart CPAP.    No Known Allergies  Immunization History  Administered Date(s) Administered  . Influenza Whole 05/01/2018  . Influenza,inj,Quad PF,6+ Mos 04/11/2020  . PFIZER Comirnaty(Gray Top)Covid-19 Tri-Sucrose Vaccine 05/29/2020  . PFIZER(Purple Top)SARS-COV-2 Vaccination 10/27/2019, 11/20/2019  . Pneumococcal Polysaccharide-23 05/01/2018  . Td 06/18/2020  . Tdap 07/04/2020    History reviewed. No pertinent past medical history.  Tobacco History: Social History   Tobacco Use  Smoking Status Never Smoker  Smokeless Tobacco Never Used   Counseling given: Not Answered   Outpatient Medications Prior to Visit  Medication Sig Dispense Refill  . ALPRAZolam (XANAX) 0.5 MG tablet   0  . aspirin 81 MG chewable tablet Chew by mouth daily.    07/06/2020 buPROPion (WELLBUTRIN XL) 150 MG 24 hr tablet Take 150 mg by mouth daily.    . cholecalciferol (VITAMIN D3) 25 MCG (1000 UNIT) tablet Take 2,000 Units by mouth daily. 2,000    . citalopram (CELEXA) 20 MG tablet   2  . famotidine (PEPCID) 40 MG tablet Take 40 mg by mouth at bedtime.    . lovastatin (MEVACOR) 20 MG tablet   3  .  Multiple Vitamin (MULTIVITAMIN WITH MINERALS) TABS tablet Take 1 tablet by mouth daily.    . naproxen sodium (ALEVE) 220 MG tablet Take 220 mg by mouth daily as needed.    Marland Kitchen NOVOLOG 100 UNIT/ML injection   9  . losartan (COZAAR) 50 MG tablet Take 50 mg by mouth daily. (Patient not taking: Reported on 08/14/2020)     No facility-administered medications prior to visit.     Review of Systems:   Constitutional:   No  weight loss, night sweats,  Fevers, chills, fatigue, or  lassitude.  HEENT:   No headaches,  Difficulty swallowing,  Tooth/dental problems, or  Sore throat,                No sneezing, itching, ear ache, nasal congestion, post nasal drip,   CV:  No chest pain,  Orthopnea, PND, swelling in lower extremities, anasarca, dizziness, palpitations, syncope.   GI  No heartburn, indigestion, abdominal pain, nausea, vomiting, diarrhea, change in bowel habits, loss of appetite, bloody stools.   Resp: No shortness of breath with exertion or at rest.  No excess mucus, no productive cough,  No non-productive cough,  No coughing up of blood.  No change in color of mucus.  No wheezing.  No chest wall deformity  Skin: no rash or lesions.  GU: no dysuria, change in color of urine, no urgency or frequency.  No flank pain, no hematuria   MS:  No joint pain or swelling.  No  decreased range of motion.  No back pain.    Physical Exam  BP 130/90 (BP Location: Left Arm, Patient Position: Sitting, Cuff Size: Large)   Pulse 79   Temp (!) 97.5 F (36.4 C) (Temporal)   Ht 5\' 6"  (1.676 m)   Wt 184 lb 3.2 oz (83.6 kg)   SpO2 99%   BMI 29.73 kg/m   GEN: A/Ox3; pleasant , NAD, well nourished    HEENT:  [place]/AT,    NOSE-clear, THROAT-clear, no lesions, no postnasal drip or exudate noted.   NECK:  Supple w/ fair ROM; no JVD; normal carotid impulses w/o bruits; no thyromegaly or nodules palpated; no lymphadenopathy.    RESP  Clear  P & A; w/o, wheezes/ rales/ or rhonchi. no accessory muscle use,  no dullness to percussion  CARD:  RRR, no m/r/g, no peripheral edema, pulses intact, no cyanosis or clubbing.  GI:   Soft & nt; nml bowel sounds; no organomegaly or masses detected.   Musco: Warm bil, no deformities or joint swelling noted.   Neuro: alert, no focal deficits noted.    Skin: Warm, no lesions or rashes    Lab Results:  CBC No results found for: WBC, RBC, HGB, HCT, PLT, MCV, MCH, MCHC, RDW, LYMPHSABS, MONOABS, EOSABS, BASOSABS  BMET No results found for: NA, K, CL, CO2, GLUCOSE, BUN, CREATININE, CALCIUM, GFRNONAA, GFRAA  BNP No results found for: BNP  ProBNP No results found for: PROBNP  Imaging: No results found.    No flowsheet data found.  No results found for: NITRICOXIDE      Assessment & Plan:   No problem-specific Assessment & Plan notes found for this encounter.     , NP 08/14/2020

## 2020-08-18 NOTE — Assessment & Plan Note (Signed)
Obstructive sleep apnea.  Patient education on CPAP compliance.  Order for new CPAP mask DreamWear full facemask. Patient is to contact her office if he does not receive this mass.  Or has any difficulties  Plan  Patient Instructions  May try the dream wear full face mask.  Restart CPAP At bedtime   Work on healthy weight loss Do not drive if sleepy CPAP download in 6 weeks  Follow-up in 6 months with Dr. Melida Quitter or Fredrica Capano NP and As needed

## 2020-08-21 ENCOUNTER — Telehealth: Payer: Self-pay | Admitting: Adult Health

## 2020-08-22 NOTE — Telephone Encounter (Signed)
Called [employer] [facility] and they have the order for the mask and they are going to reach out to the pt and get his mask ordered today.

## 2020-09-26 ENCOUNTER — Other Ambulatory Visit: Payer: Self-pay

## 2020-09-26 ENCOUNTER — Encounter: Payer: Self-pay | Admitting: Pulmonary Disease

## 2020-09-26 ENCOUNTER — Ambulatory Visit (INDEPENDENT_AMBULATORY_CARE_PROVIDER_SITE_OTHER): Payer: 59 | Admitting: Pulmonary Disease

## 2020-09-26 DIAGNOSIS — G4733 Obstructive sleep apnea (adult) (pediatric): Secondary | ICD-10-CM | POA: Diagnosis not present

## 2020-09-26 NOTE — Progress Notes (Signed)
   Subjective:    Patient ID: Matthew Mosley, male    DOB: 15-Feb-1956, 65 y.o.   MRN: [id number]  HPI  65 year old hypertensive for follow-up of mild OSA. He was started on auto CPAP 10 to 20 cm with nasal pillows.  He has settled down well with this, feels refreshed when he wakes up, no sleep pressure or daytime naps. However he has noticed a large leak on his new pillows, he developed ear infection due to pressure from the previous strap and changed to Respironics DreamWear nasal pillows.  This seems to leak from the top of his head where the hose connects to the nasal interface  Significant tests/ events reviewed  02/2018 HST >> mild, RDI 12/h    No past medical history on file.  Review of Systems Patient denies significant dyspnea,cough, hemoptysis,  chest pain, palpitations, pedal edema, orthopnea, paroxysmal nocturnal dyspnea, lightheadedness, nausea, vomiting, abdominal or  leg pains      Objective:   Physical Exam    Gen. Pleasant, well-nourished, in no distress ENT - no thrush, no pallor/icterus,no post nasal drip, overbite + Neck: No JVD, no thyromegaly, no carotid bruits Lungs: no use of accessory muscles, no dullness to percussion, clear without rales or rhonchi  Cardiovascular: Rhythm regular, heart sounds  normal, no murmurs or gallops, no peripheral edema Musculoskeletal: No deformities, no cyanosis or clubbing          Assessment & Plan:

## 2020-09-26 NOTE — Assessment & Plan Note (Signed)
CPAP download was reviewed which shows excellent compliance and good control of events on average pressure of 11 cm, large leak is noted  We will decrease pressure to 10 to 12 cm and hopefully this will help with the leak If this persist he can change to AirFit P 10 nasal pillows We discussed renewal of CPAP supplies.  He will need annual DME once he switches to Medicare and will help him with that transition  I also discussed alternative therapies to CPAP including oral appliance and upper airway stimulation but he has settled down so well with CPAP.we will continue this for now

## 2020-09-26 NOTE — Patient Instructions (Signed)
  CPAP is working well Change to auto 10-12 cm  Trial of airfit P 10 nasal pillows in the future

## 2022-12-14 ENCOUNTER — Other Ambulatory Visit (HOSPITAL_COMMUNITY): Payer: Self-pay | Admitting: Endocrinology

## 2022-12-14 DIAGNOSIS — E1069 Type 1 diabetes mellitus with other specified complication: Secondary | ICD-10-CM

## 2023-01-19 ENCOUNTER — Ambulatory Visit (HOSPITAL_BASED_OUTPATIENT_CLINIC_OR_DEPARTMENT_OTHER)
Admission: RE | Admit: 2023-01-19 | Discharge: 2023-01-19 | Disposition: A | Discharge status: Home / Self Care | Payer: Self-pay | Source: Ambulatory Visit | Attending: Endocrinology | Admitting: Endocrinology

## 2023-01-19 ENCOUNTER — Encounter (HOSPITAL_BASED_OUTPATIENT_CLINIC_OR_DEPARTMENT_OTHER): Payer: Self-pay

## 2023-01-19 DIAGNOSIS — E1069 Type 1 diabetes mellitus with other specified complication: Secondary | ICD-10-CM | POA: Insufficient documentation

## 2024-01-02 ENCOUNTER — Other Ambulatory Visit: Payer: Self-pay | Admitting: Endocrinology

## 2024-01-02 DIAGNOSIS — R911 Solitary pulmonary nodule: Secondary | ICD-10-CM

## 2024-01-17 ENCOUNTER — Ambulatory Visit
Admission: RE | Admit: 2024-01-17 | Discharge: 2024-01-17 | Disposition: A | Discharge status: Home / Self Care | Payer: Self-pay | Source: Ambulatory Visit | Attending: Endocrinology | Admitting: Endocrinology

## 2024-01-17 DIAGNOSIS — R911 Solitary pulmonary nodule: Secondary | ICD-10-CM
